# Patient Record
Sex: Female | Born: 1935 | Race: White | Hispanic: No | Marital: Single
Health system: Southern US, Community
[De-identification: ages and names within clinical notes are randomized; demographics above are authoritative.]

---

## 2005-04-08 ENCOUNTER — Ambulatory Visit: Payer: Self-pay | Admitting: Oncology

## 2005-04-09 ENCOUNTER — Ambulatory Visit: Payer: Self-pay | Admitting: Oncology

## 2005-05-10 ENCOUNTER — Ambulatory Visit: Payer: Self-pay | Admitting: Oncology

## 2005-10-10 ENCOUNTER — Ambulatory Visit: Payer: Self-pay | Admitting: Gastroenterology

## 2006-06-18 ENCOUNTER — Ambulatory Visit: Payer: Self-pay | Admitting: Oncology

## 2007-06-29 ENCOUNTER — Ambulatory Visit: Payer: Self-pay | Admitting: Internal Medicine

## 2008-10-05 ENCOUNTER — Ambulatory Visit: Payer: Self-pay | Admitting: Internal Medicine

## 2008-10-19 ENCOUNTER — Ambulatory Visit: Payer: Self-pay | Admitting: Internal Medicine

## 2008-11-07 ENCOUNTER — Ambulatory Visit: Payer: Self-pay | Admitting: Oncology

## 2008-12-01 ENCOUNTER — Ambulatory Visit: Payer: Self-pay | Admitting: Oncology

## 2008-12-08 ENCOUNTER — Ambulatory Visit: Payer: Self-pay | Admitting: Oncology

## 2009-04-09 ENCOUNTER — Ambulatory Visit: Payer: Self-pay | Admitting: Oncology

## 2009-04-19 ENCOUNTER — Ambulatory Visit: Payer: Self-pay | Admitting: Internal Medicine

## 2009-04-24 ENCOUNTER — Ambulatory Visit: Payer: Self-pay | Admitting: Oncology

## 2009-05-10 ENCOUNTER — Ambulatory Visit: Payer: Self-pay | Admitting: Oncology

## 2010-06-13 ENCOUNTER — Ambulatory Visit: Payer: Self-pay | Admitting: Internal Medicine

## 2011-08-05 ENCOUNTER — Emergency Department: Payer: Self-pay | Admitting: Unknown Physician Specialty

## 2011-08-26 ENCOUNTER — Ambulatory Visit: Payer: Self-pay | Admitting: Internal Medicine

## 2012-01-28 ENCOUNTER — Ambulatory Visit: Payer: Self-pay

## 2012-08-26 ENCOUNTER — Ambulatory Visit: Payer: Self-pay | Admitting: Internal Medicine

## 2012-09-08 ENCOUNTER — Ambulatory Visit: Payer: Self-pay | Admitting: Internal Medicine

## 2012-12-05 IMAGING — CR DG CHEST 2V
1 series · 2 of 2 positions shown · non-contrast
Comparison: none

REASON FOR EXAM: cp
COMMENTS:

PROCEDURE:     DXR - DXR CHEST PA (OR AP) AND LATERAL  - August 05, 2011  [DATE]
RESULT:     Comparison: 04/08/2005

[Series 1: w chest pa · 0.14mm/px · 2 of 2 slices shown]
[im 1/2]
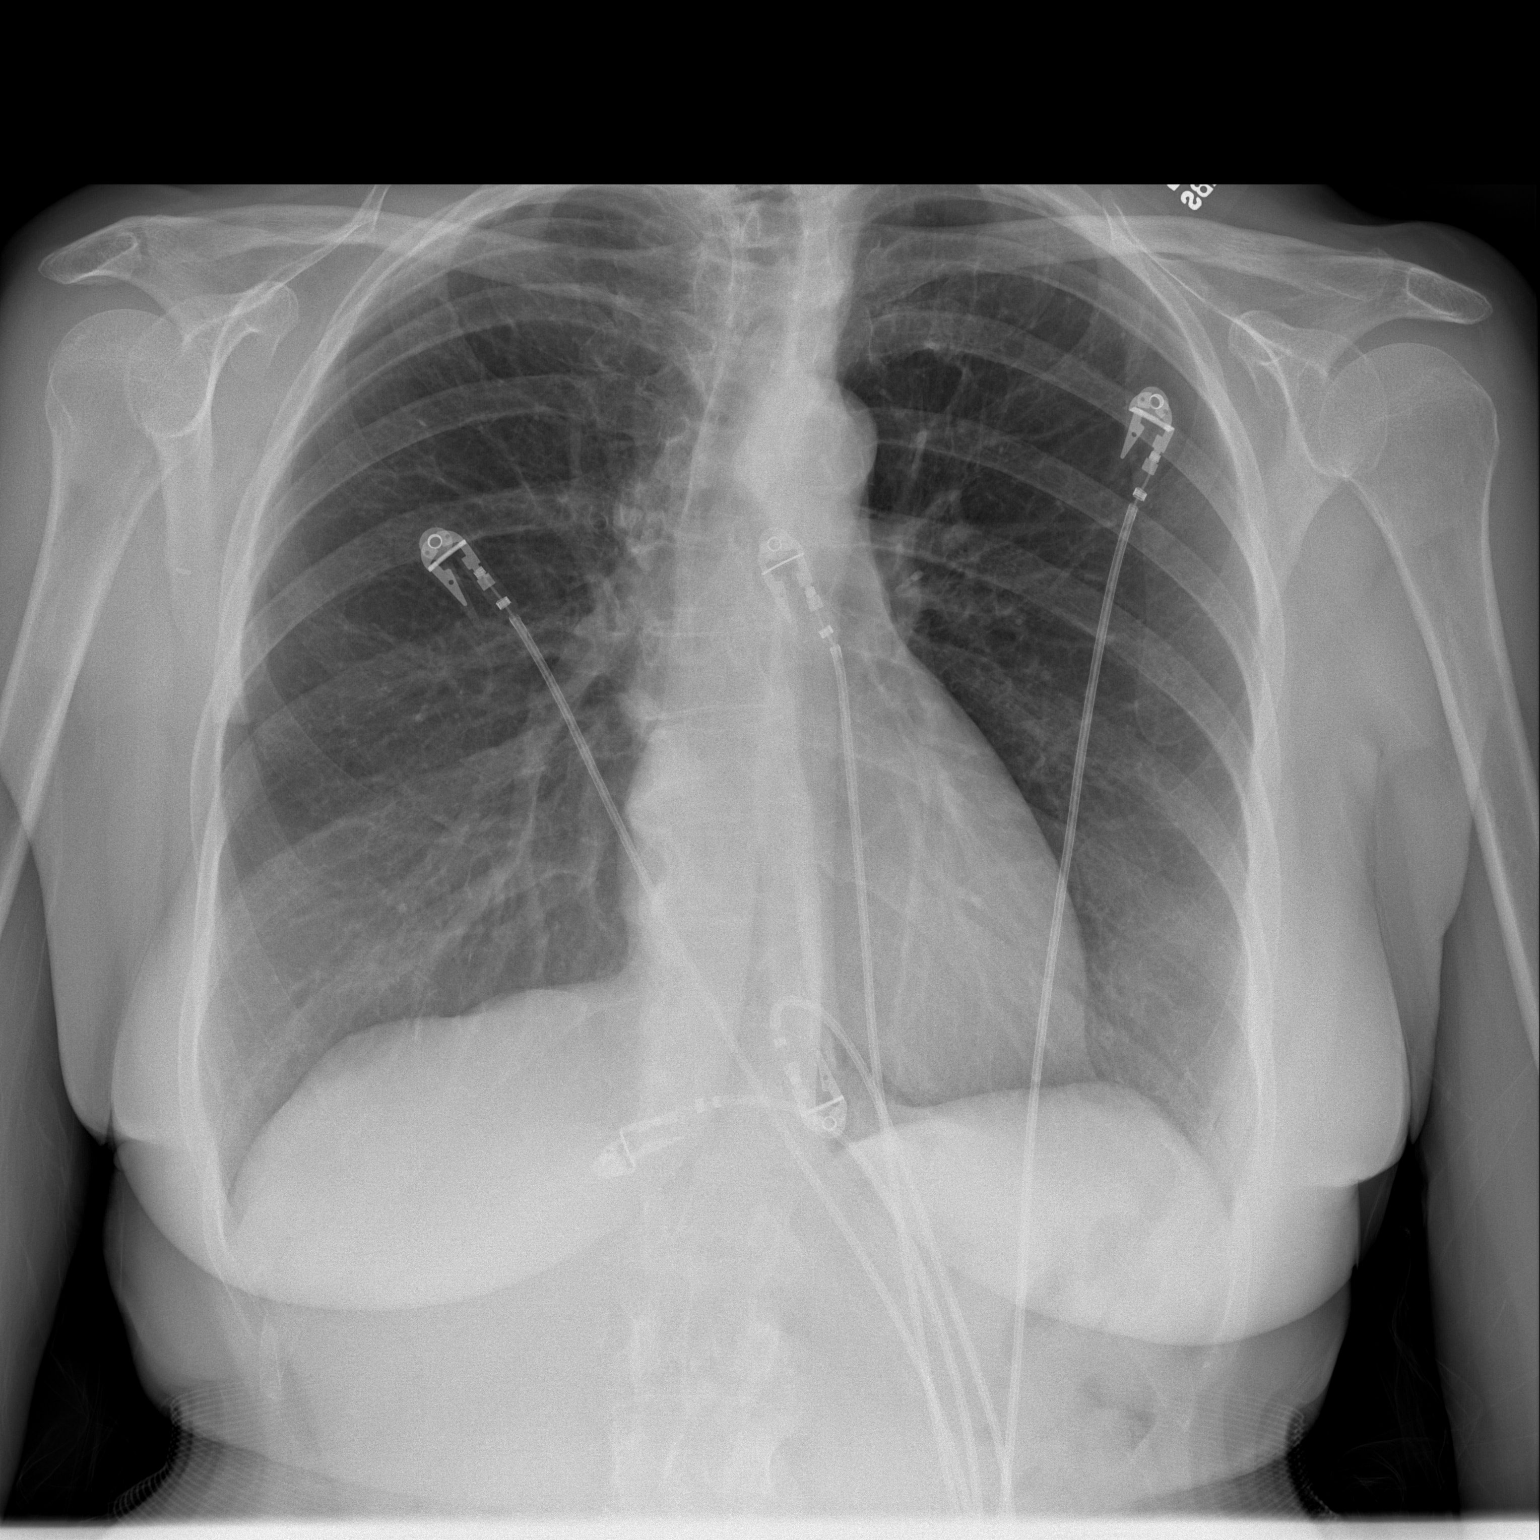
[im 2/2]
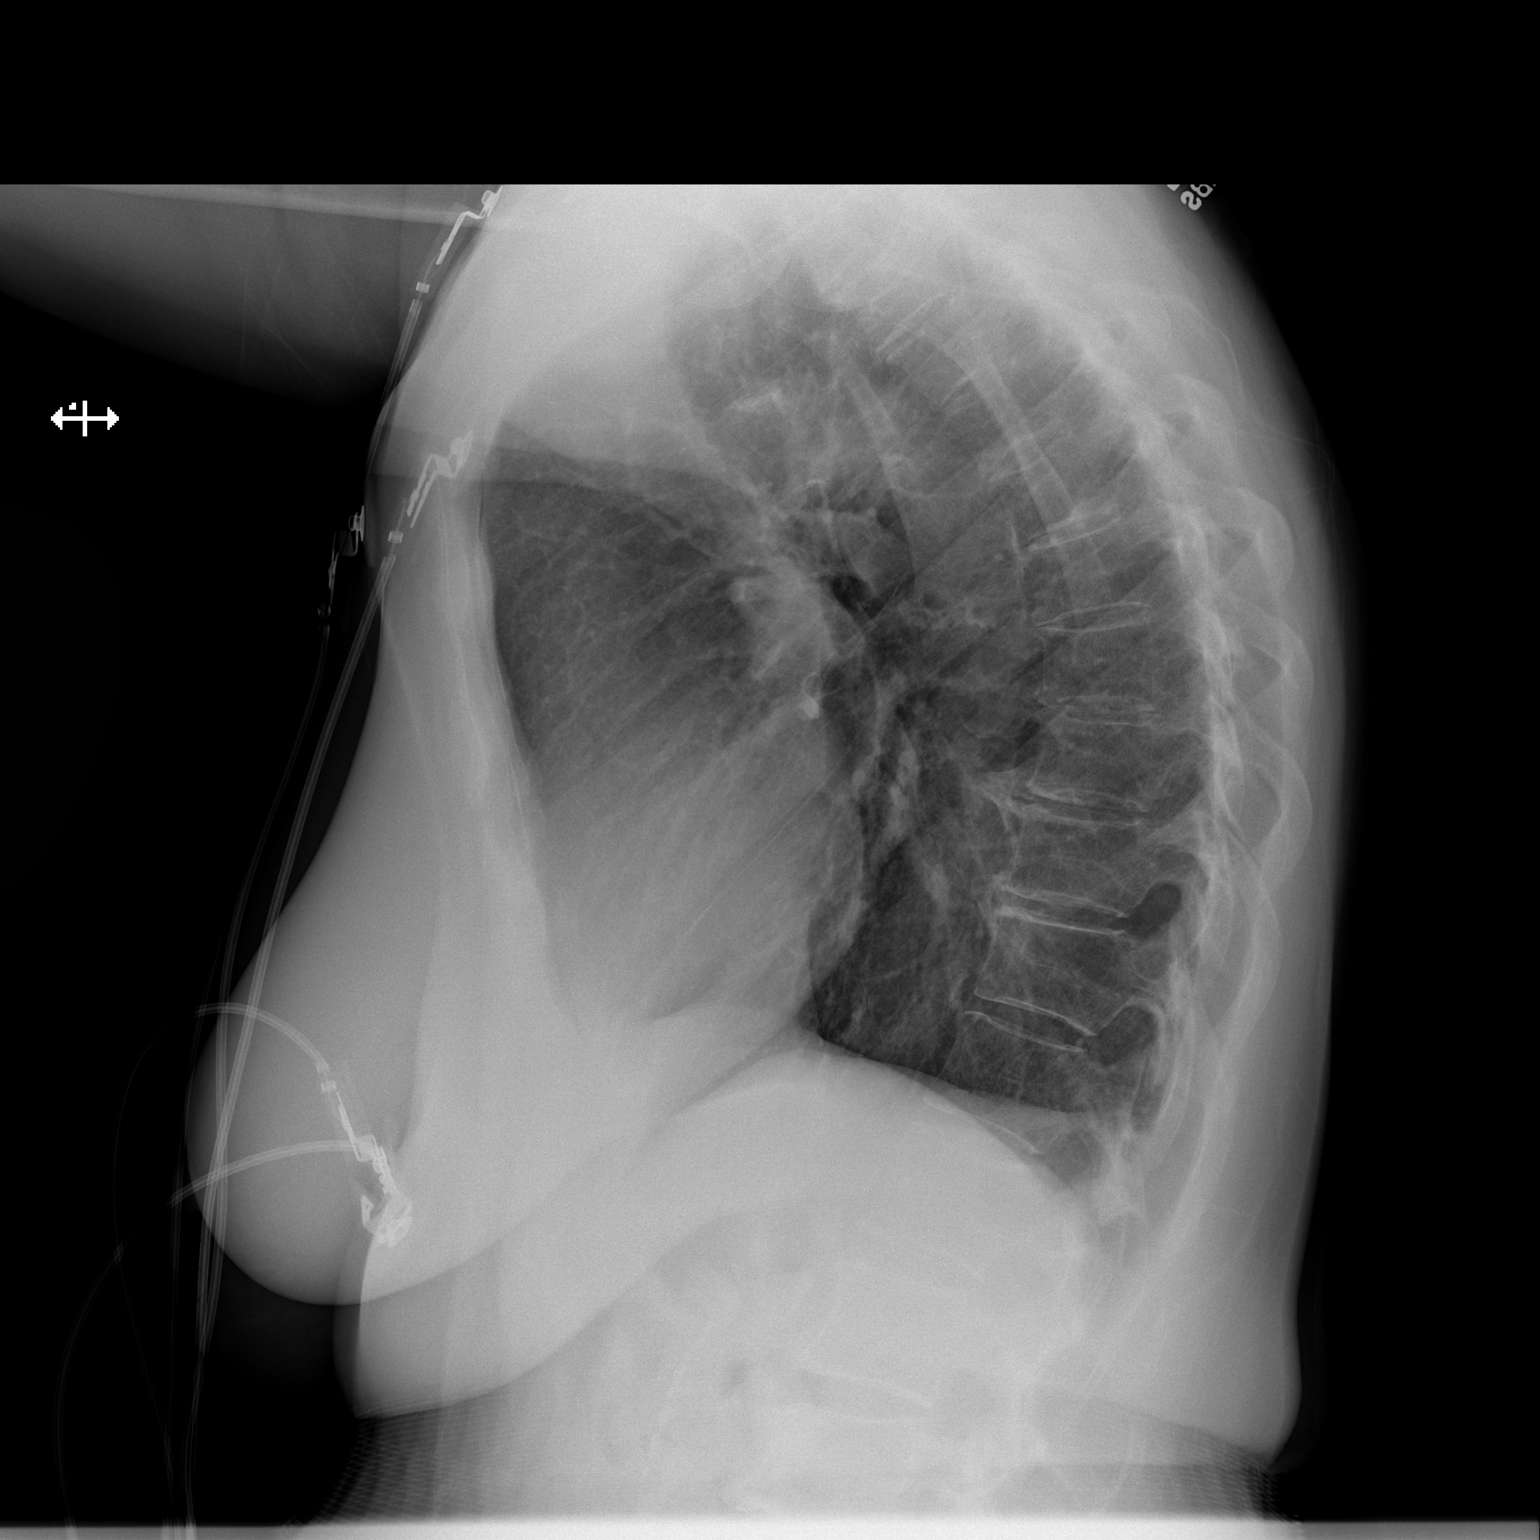

[2 of 2 positions shown; findings below may reference images not displayed]

FINDINGS: The heart is normal in size. There is a minimal is a curvature of the
thoracic spine. The lungs are clear. There is blunting of the right
posterior costophrenic angle which could be secondary to a small effusion
versus pleural thickening or eventration of the hemidiaphragm. This is
similar to prior.
IMPRESSION: 1. Blunting of the right posterior costophrenic angle may represent a small
pleural effusion versus pleural thickening or small eventration of the right
hemidiaphragm. This is similar prior.
2. Otherwise, no acute cardiopulmonary disease.

## 2013-10-04 ENCOUNTER — Ambulatory Visit: Payer: Self-pay | Admitting: Rheumatology

## 2013-11-23 ENCOUNTER — Ambulatory Visit: Payer: Self-pay | Admitting: Internal Medicine

## 2014-02-19 ENCOUNTER — Inpatient Hospital Stay: Payer: Self-pay | Admitting: Internal Medicine

## 2014-02-19 LAB — PROTIME-INR
INR: 1.1
Prothrombin Time: 13.9 secs (ref 11.5–14.7)

## 2014-02-19 LAB — CBC
HCT: 40.1 % (ref 35.0–47.0)
HGB: 13.1 g/dL (ref 12.0–16.0)
MCH: 30.4 pg (ref 26.0–34.0)
MCHC: 32.6 g/dL (ref 32.0–36.0)
MCV: 93 fL (ref 80–100)
Platelet: 199 10*3/uL (ref 150–440)
RBC: 4.3 10*6/uL (ref 3.80–5.20)
RDW: 13.4 % (ref 11.5–14.5)
WBC: 12.8 10*3/uL — ABNORMAL HIGH (ref 3.6–11.0)

## 2014-02-19 LAB — CK TOTAL AND CKMB (NOT AT ARMC)
CK, Total: 79 U/L
CK-MB: 2.8 ng/mL (ref 0.5–3.6)

## 2014-02-19 LAB — COMPREHENSIVE METABOLIC PANEL
ALBUMIN: 3.4 g/dL (ref 3.4–5.0)
ALK PHOS: 73 U/L
ALT: 88 U/L — AB (ref 12–78)
AST: 92 U/L — AB (ref 15–37)
Anion Gap: 10 (ref 7–16)
BUN: 17 mg/dL (ref 7–18)
Bilirubin,Total: 1.5 mg/dL — ABNORMAL HIGH (ref 0.2–1.0)
CALCIUM: 8.8 mg/dL (ref 8.5–10.1)
CHLORIDE: 105 mmol/L (ref 98–107)
CO2: 22 mmol/L (ref 21–32)
CREATININE: 0.88 mg/dL (ref 0.60–1.30)
EGFR (African American): >60
Glucose: 133 mg/dL — ABNORMAL HIGH (ref 65–99)
Osmolality: 277 (ref 275–301)
POTASSIUM: 3.9 mmol/L (ref 3.5–5.1)
Sodium: 137 mmol/L (ref 136–145)
Total Protein: 6.9 g/dL (ref 6.4–8.2)

## 2014-02-19 LAB — CK-MB: CK-MB: 2.3 ng/mL (ref 0.5–3.6)

## 2014-02-19 LAB — TROPONIN I: TROPONIN-I: 0.04 ng/mL

## 2014-02-19 LAB — PRO B NATRIURETIC PEPTIDE: B-Type Natriuretic Peptide: 7944 pg/mL — ABNORMAL HIGH (ref 0–450)

## 2014-02-19 LAB — APTT: ACTIVATED PTT: 30.7 s (ref 23.6–35.9)

## 2014-02-20 LAB — BASIC METABOLIC PANEL
Anion Gap: 9 (ref 7–16)
BUN: 15 mg/dL (ref 7–18)
CALCIUM: 8.6 mg/dL (ref 8.5–10.1)
Chloride: 104 mmol/L (ref 98–107)
Co2: 24 mmol/L (ref 21–32)
Creatinine: 0.93 mg/dL (ref 0.60–1.30)
GFR CALC NON AF AMER: 59 — AB
GLUCOSE: 142 mg/dL — AB (ref 65–99)
OSMOLALITY: 277 (ref 275–301)
POTASSIUM: 3.6 mmol/L (ref 3.5–5.1)
Sodium: 137 mmol/L (ref 136–145)

## 2014-02-20 LAB — CBC WITH DIFFERENTIAL/PLATELET
BASOS ABS: 0 10*3/uL (ref 0.0–0.1)
Basophil %: 0.1 %
EOS ABS: 0 10*3/uL (ref 0.0–0.7)
Eosinophil %: 0 %
HCT: 37.4 % (ref 35.0–47.0)
HGB: 12.7 g/dL (ref 12.0–16.0)
LYMPHS ABS: 0.4 10*3/uL — AB (ref 1.0–3.6)
Lymphocyte %: 3.6 %
MCH: 31.5 pg (ref 26.0–34.0)
MCHC: 33.9 g/dL (ref 32.0–36.0)
MCV: 93 fL (ref 80–100)
Monocyte #: 0.1 x10 3/mm — ABNORMAL LOW (ref 0.2–0.9)
Monocyte %: 1.2 %
NEUTROS ABS: 9.3 10*3/uL — AB (ref 1.4–6.5)
NEUTROS PCT: 95.1 %
Platelet: 183 10*3/uL (ref 150–440)
RBC: 4.01 10*6/uL (ref 3.80–5.20)
RDW: 13.5 % (ref 11.5–14.5)
WBC: 9.8 10*3/uL (ref 3.6–11.0)

## 2014-02-20 LAB — HEMOGLOBIN A1C: HEMOGLOBIN A1C: 5.7 % (ref 4.2–6.3)

## 2014-02-20 LAB — LIPID PANEL
Cholesterol: 145 mg/dL (ref 0–200)
HDL: 73 mg/dL — AB (ref 40–60)
Ldl Cholesterol, Calc: 65 mg/dL (ref 0–100)
Triglycerides: 36 mg/dL (ref 0–200)
VLDL Cholesterol, Calc: 7 mg/dL (ref 5–40)

## 2014-02-20 LAB — TSH: THYROID STIMULATING HORM: 0.504 u[IU]/mL

## 2014-02-20 LAB — CK-MB: CK-MB: 2.1 ng/mL (ref 0.5–3.6)

## 2014-02-20 LAB — TROPONIN I
TROPONIN-I: 0.11 ng/mL — AB
Troponin-I: 0.05 ng/mL

## 2014-02-21 LAB — CBC WITH DIFFERENTIAL/PLATELET
BASOS ABS: 0 10*3/uL (ref 0.0–0.1)
Basophil %: 0.1 %
Eosinophil #: 0 10*3/uL (ref 0.0–0.7)
Eosinophil %: 0 %
HCT: 37.5 % (ref 35.0–47.0)
HGB: 12.7 g/dL (ref 12.0–16.0)
LYMPHS ABS: 1.2 10*3/uL (ref 1.0–3.6)
LYMPHS PCT: 8.9 %
MCH: 31.4 pg (ref 26.0–34.0)
MCHC: 33.7 g/dL (ref 32.0–36.0)
MCV: 93 fL (ref 80–100)
MONOS PCT: 5.3 %
Monocyte #: 0.7 x10 3/mm (ref 0.2–0.9)
NEUTROS ABS: 11.1 10*3/uL — AB (ref 1.4–6.5)
Neutrophil %: 85.7 %
Platelet: 193 10*3/uL (ref 150–440)
RBC: 4.03 10*6/uL (ref 3.80–5.20)
RDW: 13.6 % (ref 11.5–14.5)
WBC: 13 10*3/uL — ABNORMAL HIGH (ref 3.6–11.0)

## 2014-02-21 LAB — BASIC METABOLIC PANEL
ANION GAP: 9 (ref 7–16)
BUN: 27 mg/dL — ABNORMAL HIGH (ref 7–18)
CALCIUM: 8.5 mg/dL (ref 8.5–10.1)
CREATININE: 1.08 mg/dL (ref 0.60–1.30)
Chloride: 102 mmol/L (ref 98–107)
Co2: 25 mmol/L (ref 21–32)
EGFR (African American): 57 — ABNORMAL LOW
GFR CALC NON AF AMER: 49 — AB
Glucose: 113 mg/dL — ABNORMAL HIGH (ref 65–99)
Osmolality: 278 (ref 275–301)
Potassium: 3.5 mmol/L (ref 3.5–5.1)
SODIUM: 136 mmol/L (ref 136–145)

## 2014-02-22 LAB — BASIC METABOLIC PANEL
ANION GAP: 4 — AB (ref 7–16)
BUN: 29 mg/dL — ABNORMAL HIGH (ref 7–18)
CALCIUM: 8.5 mg/dL (ref 8.5–10.1)
CHLORIDE: 105 mmol/L (ref 98–107)
CREATININE: 1.09 mg/dL (ref 0.60–1.30)
Co2: 28 mmol/L (ref 21–32)
EGFR (Non-African Amer.): 49 — ABNORMAL LOW
GFR CALC AF AMER: 57 — AB
Glucose: 80 mg/dL (ref 65–99)
OSMOLALITY: 279 (ref 275–301)
Potassium: 3.4 mmol/L — ABNORMAL LOW (ref 3.5–5.1)
SODIUM: 137 mmol/L (ref 136–145)

## 2014-02-22 LAB — WBC: WBC: 11 10*3/uL (ref 3.6–11.0)

## 2014-03-07 ENCOUNTER — Ambulatory Visit: Payer: Self-pay | Admitting: Family

## 2014-03-22 ENCOUNTER — Ambulatory Visit: Payer: Self-pay | Admitting: Obstetrics and Gynecology

## 2014-04-06 ENCOUNTER — Ambulatory Visit: Payer: Self-pay | Admitting: Family

## 2014-06-01 ENCOUNTER — Ambulatory Visit: Payer: Self-pay | Admitting: Family

## 2014-06-13 ENCOUNTER — Emergency Department: Payer: Self-pay | Admitting: Emergency Medicine

## 2014-06-13 LAB — URINALYSIS, COMPLETE
Bacteria: NONE SEEN
Bilirubin,UR: NEGATIVE
Blood: NEGATIVE
Glucose,UR: NEGATIVE mg/dL (ref 0–75)
KETONE: NEGATIVE
NITRITE: NEGATIVE
Ph: 5 (ref 4.5–8.0)
Protein: NEGATIVE
Specific Gravity: 1.013 (ref 1.003–1.030)
WBC UR: 26 /HPF (ref 0–5)

## 2014-06-15 LAB — URINE CULTURE

## 2014-06-28 ENCOUNTER — Ambulatory Visit: Payer: Self-pay | Admitting: Internal Medicine

## 2014-08-15 ENCOUNTER — Ambulatory Visit: Payer: Self-pay | Admitting: Family

## 2014-09-21 ENCOUNTER — Ambulatory Visit: Payer: Self-pay | Admitting: Cardiology

## 2014-11-05 ENCOUNTER — Inpatient Hospital Stay: Payer: Self-pay | Admitting: Internal Medicine

## 2014-11-06 ENCOUNTER — Ambulatory Visit: Payer: Self-pay | Admitting: Neurology

## 2014-11-08 ENCOUNTER — Ambulatory Visit
Admit: 2014-11-08 | Disposition: A | Discharge status: Home / Self Care | Payer: Self-pay | Attending: Internal Medicine | Admitting: Internal Medicine

## 2014-12-09 ENCOUNTER — Ambulatory Visit
Admit: 2014-12-09 | Disposition: A | Discharge status: Home / Self Care | Payer: Self-pay | Attending: Internal Medicine | Admitting: Internal Medicine

## 2014-12-09 DEATH — deceased

## 2014-12-31 NOTE — H&P (Signed)
PATIENT NAMMarlene [name]:  Lisa [name], Lisa [name] MR#:  [id number] DATE OF BIRTH:  06/23/1934  DATE OF ADMISSION:  02/19/2014  PRIMARY CARE PHYSICIAN:  Dr. Daniel NonesBert Klein.   REFERRING PHYSICIAN:  Dr. Daryel NovemberJonathan Williams.   CHIEF COMPLAINT:  Cough, shortness of breath.   HISTORY OF PRESENT ILLNESS:  Lisa [name] is a 81 year old pleasant white female with a history of hypertension, has been having cough with mild productive sputum for the last two weeks.  This is associated with shortness of breath.  Did not notice any increase with swelling in the lower extremities.  However, the shortness of breath has been gradually getting worse.  Today, the patient's sister insisted her to come to the Emergency Department.  Work-up in the Emergency Department, CT of the chest showed bilateral infiltrates.  X-ray shows bilateral pleural effusions associated with air space disease, mild pulmonary vascular congestion with some element of congestive heart failure.  The patient is also found to have elevated BNP of 8000, has mild elevation of the WBC count of 12.8 and a fever of 99.8.  Did not notice any fever at home.  Has significantly decreased appetite, has been having diarrhea, watery stools.  The patient received one dose of Lasix in the Emergency Department, diuresed about a liter of urine in the last 2 to 3 hours.   PAST MEDICAL HISTORY: 1.  Hypertension.  2.  Hyperlipidemia.  3.  Hypercalcemia.  4.  History of breast cancer, status post right mastectomy.  5.  Degenerative joint disease.  6.  Depression.  7.  Osteoporosis.   PAST SURGICAL HISTORY:  Mastectomy.   ALLERGIES:  SULFA.   HOME MEDICATIONS:   1.  Trazodone 25 mg once a day.   2.  Micardis 80 mg once a day.  3.  Aspirin 81 mg 2 tablets as needed.  4.  Alprazolam 0.5 mg as needed.   SOCIAL HISTORY:  No history of smoking, drinking alcohol or using illicit drugs, exposed to secondhand smoking from her daughter.  At baseline, is independent of ADLs and IADLs.    FAMILY HISTORY:  Mother with heart problems.   REVIEW OF SYSTEMS: CONSTITUTIONAL:  Experiencing generalized weakness.  EYES:  No change in vision.  EARS, NOSE, THROAT:  No change in hearing.  RESPIRATORY:  Has cough, shortness of breath.  CARDIOVASCULAR:  No chest pain, palpations.  GASTROINTESTINAL:  Has decreased appetite, has been having diarrhea.  GENITOURINARY:  No dysuria or hematuria.  HEMATOLOGIC:  No easy bruising or bleeding.  SKIN:  No rash or lesions.  ENDOCRINE:  No polyuria or polydipsia.  MUSCULOSKELETAL:  No joint pains and aches.  NEUROLOGIC:  No weakness or numbness in any part of the body.   PHYSICAL EXAMINATION:  GENERAL:  This is a well-built, well-nourished, age-appropriate female lying down in the bed, not in distress.  VITAL SIGNS:  Temperature 99.8, pulse 122, blood pressure 117/64, respiratory rate of 20, oxygen saturation is 94% on 2 liters of oxygen.  HEENT:  Head normocephalic, atraumatic.  Eyes, no scleral icterus.  Conjunctivae normal.  Pupils equal and react to light.  Extraocular movements are intact.  Mucous membranes moist.  No pharyngeal erythema.  NECK:  Supple.  No lymphadenopathy.  No JVD.  No carotid bruit.  No thyromegaly.  CHEST:  Has no focal tenderness.  Bilateral coarse breath sounds, bilateral wheezing.  HEART:  S1, S2, regular, tachycardia.  ABDOMEN:  Bowel sounds plus.  Soft, nontender, nondistended.  No hepatosplenomegaly.  EXTREMITIES:  No pedal edema.  Pulses 2+.  SKIN:  No rash or lesions.  MUSCULOSKELETAL:  Good range of motion in all the extremities.  NEUROLOGIC:  The patient is alert, oriented to place, person, and time.  Cranial nerves II through XII intact.  Motor 5 by 5 in upper and lower extremities.   LABORATORY DATA:  CMP is completely within normal limits.  CBC:  WBC of 12.8.  The rest of all the values are within normal limits.  Coag profile is well within normal limits.  CK 79, CK-MB of 2.8.  Troponin 0.04.  BNP of 8000.   Chest x-ray, air space disease, bilateral pleural effusions.  EKG, 12-lead:  Tachycardia, supraventricular.   ASSESSMENT AND PLAN:  Lisa [name] is a 81 year old female who comes with pneumonia and congestive heart failure, new onset.  1.  Pneumonia.  Treat it as a community-acquired pneumonia with Rocephin and Zithromax, DuoNebs and Solu-Medrol considering the patient's associated bronchitis.  2.  Congestive heart failure, new onset.  We will obtain echocardiogram.  We will continue with the diuresis.  Keep the patient on metoprolol 25 mg twice daily.  We will also obtain TSH, lipid profile and cardiac enzymes as well.  3.  Supraventricular tachycardia.  Most likely this is secondary to underlying infection.  Keep the patient on metoprolol 25 mg twice daily and follow up.  4.  Bilateral pleural effusions.  We will continue with the treatment for congestive heart failure as well as for pneumonia and follow up.  5.  Diarrhea.  We will check the Clostridium difficile toxin.  6.  Hypertension, currently well-controlled.  Continue with metoprolol and follow up.  Hold the Micardis for now.  7.  Keep the patient on deep vein thrombosis prophylaxis with Lovenox.   TIME SPENT:  50 minutes.     ____________________________ Susa Griffins, MD pv:ea D: 02/19/2014 22:32:12 ET T: 02/20/2014 01:54:14 ET JOB#: [id number]  cc: Susa Griffins, MD, <Dictator> Lynnea Ferrier, MD Susa Griffins MD ELECTRONICALLY SIGNED 02/23/2014 7:20

## 2014-12-31 NOTE — Discharge Summary (Signed)
PATIENT NAMMarlene [name]:  Maute, Nubia MR#:  [id number] DATE OF BIRTH:  [birth year]  DATE OF ADMISSION:  02/19/2014 DATE OF DISCHARGE:  02/22/2014  FINAL DIAGNOSES: 1.  Acute left-sided systolic congestive heart failure.  2.  Acute respiratory failure secondary to #1.  3.  Possible pneumonia.  4.  Hypertension, accelerated, labile.  5.  Anxiety.   HISTORY AND PHYSICAL: Please see dictated admission history and physical.   HOSPITAL COURSE: The patient was admitted with acute respiratory failure and evidence of pulmonary edema. There was a question of pneumonia, with the patient having low-grade temperature to 100.4 on arrival, however no further fevers during her hospitalization. She did have some cough, but no real sputum production. She was seen by cardiology, she underwent echocardiogram which revealed reduced LV ejection fraction at 40% to 45% with some evidence of diastolic dysfunction as well.   She continued to respond well to diuretics, she was changed over to oral medications. Heart rate and blood pressure responded well to treatment in addition. She ambulated with physical therapy and was weaned off of oxygen. She went 125 feet, although she did have some balance issues, part of which is chronic, and she has outpatient physical therapy already arranged for this. She was adamant that she needed to go home, so at this time she will be discharged to home in stable condition with physical activity to be up as tolerated with a single-prong cane. She is advised to weigh herself daily, calling for more than 2 pounds gain in 1 day or 5 pounds in 1 week or increasing signs or symptoms of heart failure, which were reviewed with the patient. She should follow a 2 gram sodium diet. We will have her follow up in our office within the next 1 week. She asked us to hold off on making a cardiology appointment until she saw us in the office, although she is aware that we strongly recommend that she follow up with  cardiologist for consideration for stress testing versus cardiac catheterization. She was noted to have some elevated troponins, however, this was thought to be demand ischemia, but given her new onset of heart failure, the possibility of ischemia must be further addressed.   DISCHARGE MEDICATIONS: 1.  Micardis 80 mg p.o. daily.  2.  Xanax 0.25 mg p.o. daily as needed for anxiety.  3.  Calcium 500 mg p.o. daily.  4.  Vitamin D 1000 units p.o. daily.  5.  Multivitamin 1 p.o. daily.  6.  Aldactone 25 mg p.o. daily.  7.  Carvedilol 6.25 mg p.o. b.i.d.  8.  Furosemide 40 mg p.o. daily.  9.  Levofloxacin 250 mg daily x7 days to complete a course.  10.  Aspirin 81 mg p.o. daily.   ____________________________ Lynnea FerrierBert J. Klein III, MD bjk:sb D: 02/22/2014 08:02:50 ET T: 02/22/2014 08:26:03 ET JOB#: [id number]  cc: Curtis SitesBert J. Klein III, MD, <Dictator> Daniel NonesBERT KLEIN MD ELECTRONICALLY SIGNED 02/23/2014 8:08

## 2014-12-31 NOTE — Consult Note (Signed)
PATIENT NAMMarlene [name]:  [name], Lisa [name] MR#:  [id number] DATE OF BIRTH:  1934-08-21  DATE OF CONSULTATION:  02/20/2014  CONSULTING PHYSICIAN:  Laurier NancyShaukat A. Maneh Sieben, MD  INDICATION FOR CONSULTATION: Congestive heart failure.   HISTORY OF PRESENT ILLNESS: This is a 81 year old white female  with a past medical history of hypertension, who has been normally followed with me in the office at [facility], but is a patient of Dr. Graciela HusbandsKlein, presented to the hospital with a 3 to 4 month history of shortness of breath, PND, orthopnea, but no leg swelling. She has been having also cough productive of white to yellow sputum. Her BNP when she came was 8000. I was asked to evaluate the patient because of congestive heart failure. She also complains of occasional tightness in the left precordium associated with shortness of breath.   PAST MEDICAL HISTORY: History of hypertension, hyperlipidemia, hypercalcemia, history of breast cancer, status post right mastectomy, degenerative joint disease, depression, osteoporosis.   ALLERGIES: SULFA.   PAST SURGICAL HISTORY: She had mastectomy in the past.   HOME MEDICATIONS: Micardis 80 mg, aspirin 81 mg.   SOCIAL HISTORY: She denies EtOH abuse or smoking.   FAMILY HISTORY: Mother had, she is not clear about it but mother had heart disease and a brother has heart disease.   PHYSICAL EXAMINATION: GENERAL: She is alert, oriented x 3, a bit confused but recognizes who I am.  VITAL SIGNS: Blood pressure is 115/68, respirations 18, pulse 98, temperature 98.1, saturation is 89.  NECK: Revealed positive 8 cm JVD.  LUNGS: There is few crepitation at the bases.  HEART: Regular rate and rhythm. Normal S1, S2. No audible murmur.   ABDOMEN: Soft, nontender, positive bowel sounds.  EXTREMITIES: No pedal edema. EKG was done which shows, had supraventricular tachycardia, 143 beats per minute, old anteroseptal wall myocardial infarction, nonspecific ST-T changes. The monitor currently shows  sinus rhythm. Echo was done. Ejection fraction was 40% to 45%, elevated left atrial pressure. Mild tricuspid regurgitation, mild aortic fiber calcification, mild to moderate mitral regurgitation, but she had large left pleural effusion. Her troponin was 0.11, CPK was okay. Her cholesterol panel was unremarkable. HDL is actually very good 73. Her BUN is 15, creatinine 0.93. Her BNP was 7944.   ASSESSMENT AND PLAN: The patient has systolic heart failure with decreased left ventricular systolic function on echocardiogram with trivial pericardial effusion, but large left lower effusion. She has mildly elevated troponin due to demand ischemia. Initially when she presented, she presented with supraventricular tachycardia, right now she is back in sinus rhythm. She was started on Lovenox, antibiotics, steroids,  but she is also getting Lasix 40 mg IV twice a day. We will add Aldactone because of the left ventricular dysfunction, and also we will add ACE inhibitor. She probably has coronary artery disease and will need further work-up for that. At this time, we get her from decompensated to compensated heart failure, and may have to do cardiac catheterization during this hospitalization. Thank you very much for the referral.   ____________________________ Laurier NancyShaukat A. Kimberli Winne, MD sak:sg D: 02/20/2014 11:58:46 ET T: 02/20/2014 13:04:50 ET JOB#: [id number]  cc: Laurier NancyShaukat A. Cruise Baumgardner, MD, <Dictator> Laurier NancySHAUKAT A Charmayne Odell MD ELECTRONICALLY SIGNED 03/23/2014 9:21

## 2015-01-08 NOTE — Discharge Summary (Signed)
 PATIENT NAMMarlene [name]:  Mankowski, Memorie MR#:  [id number] DATE OF BIRTH:  1933/11/05  DATE OF ADMISSION:  11/05/2014 DATE OF DISCHARGE:  11/14/2014  FINAL DIAGNOSES:  1. Acute cerebrovascular accident, likely due to atrial fibrillation.  2. Expressive aphasia and right hemiplegia secondary to cerebrovascular accident.  3. Chronic left-sided systolic congestive heart failure.  4. Hypertension, accelerated.  5. Metabolic encephalopathy.   HISTORY AND PHYSICAL: Please see dictated admission history and physical.   HOSPITAL COURSE: The patient was admitted after being found at home aphasic and with right-sided weakness. Imaging confirmed a CVA. She was placed on telemetry monitor and rhythm was followed, and no significant dysrhythmias were seen. Neurology saw the patient and they felt that, due to her severe CHF and the nature of the stroke on imaging, that this is likely an embolic event and that she had high likelihood of atrial fibrillation as a source.   Physical therapy, occupational therapy and speech therapy worked with the patient. She had some mild improvement in her right-sided weakness, however, remained aphasic. She had episodes of significant agitation and confusion consistent with metabolic encephalopathy. Repeat imaging was performed, which did not show any cerebral edema.   Her oral intake was followed closely, and it was clear that she would not be able to take enough in orally to sustain herself. A long discussion was held with her family members, including her brother and her sister, and with the assistance of palliative care. She was a DO NOT RESUSCITATE. We addressed PEG tube after she showed little improvement over the course of about a week, and the family members are in agreement that she would never want a PEG tube. This is consistent with my experience with her through the years that I have been her primary care provider.  Upon further discussion, she was made comfort measures only and  was transferred to hospice home.   DISCHARGE MEDICATIONS:  1. Lasix 40 mg p.o. daily.  2. Zofran 4 mg sublingually q. 6 hours as needed for nausea.  3. Lorazepam 0.5 mg 1 to 2 tablets p.o. q. 2-4 hours as needed for agitation or anxiety.  4. Morphine 20 mg/mL, 0.5. mL p.o. q. 1-2 hours.   DIET: As tolerated.  ACTIVITY: As tolerated.  Foley catheter left in place secondary to urinary retention. Oxygen at 2 liters nasal cannula to use as needed.   All of the medications were stopped as the target was comfort for this patient. As stated above, the patient was DO NOT RESUSCITATE, and an out-of-facility DNR was sent with her to the facility.     ____________________________ Lynnea FerrierBert J. Martina Brodbeck III, MD bjk:jh D: 11/15/2014 07:47:09 ET T: 11/15/2014 17:05:40 ET JOB#: [id number]  cc: Lynnea FerrierBert J. Spenser Harren III, MD, <Dictator> Daniel NonesBERT Seniyah Esker MD ELECTRONICALLY SIGNED  8:44

## 2015-01-08 NOTE — Consult Note (Signed)
HEMATOLOGY followup note - family at bedside states overall weakness same. Aphasic.still weak but feels better overall. no fevers. Per d/w Dr.Klein, being considered for PEG tube.resting in bed, alert and follows simple commands, aphasic, no acute distress.            vitals - 99.7, 111, 19, 132/82, 98% on room air          lungs - bilateral good breath sounds, no rhonchi          abd - soft, NT          ext - no pedal edema  WBC 10.0, Hb 16.6, Hct 50.8%, platelets 78K, Cr 0.8, Ca 8.5, Bili 2.4, albumin 2.9. INR 1.3, PTT unremarkable. Repeat Fibrinogen today 154. Serum  EPO 35. HIPA negative.  81 year old woman who was admitted on 11/05/14 with a left-sided middle cerebral artery subacute infarct.  On admission, her platelet count was 132K, then decreased to 64K on 11/10/14. Etiology of thrombocytopenia unclear, HIPA is  negative. Platelet count slightly better at 78K, no obvious bleeding issues. Per d/w Dr.Klein, anticoagulation being deferred till PEG tube is inserted and platelet count further improves. Continue to monitor. Low fibrinogen is likely from consumption or possibility of occult chronic liver disease (has INR of 1.3, abnormal bili, low albumin). Also has persistent Erythrocytosis of unclear etiology, workup is pending. Given CVA, will try small volume phlebotomy 250 mL today to try and control Hct better. Otherwise continue Hematology recommendations as per Dr.Corcoran's note from yesterday. Will continue to follow intermittently as indicated.   Electronic Signatures: Izola PricePandit, Ricco Dershem Raj (MD)  (Signed on 06-Mar-16 00:21)  Authored  Last Updated: 06-Mar-16 00:21 by Izola PricePandit, Zhoe Catania Raj (MD)

## 2015-01-08 NOTE — Consult Note (Signed)
PATIENT NAMMarlene [name]:  [name], Lisa [name]:  July 23, 1934  DATE OF CONSULTATION:  11/06/2014  CONSULTING PHYSICIAN:  Pauletta BrownsYuriy Jozi Malachi, MD  REASON FOR CONSULTATION:  Stroke.   HISTORY OF PRESENT ILLNESS:  This is a 81 year old female with past medical history of hypertension, history of diagnosed chronic heart failure, questionable history of atrial fibrillation presenting with episode of aphasia that is likely a few days old as well as a right facial droop. The patient was found to have large left frontal stroke appearing in the MCA division and the patient is aphasic.   PAST MEDICAL HISTORY: Significant for congestive heart failure, hypertension, hyperlipidemia, and hypercapnia.   HOME MEDICATIONS: Reviewed.  SOCIAL HISTORY:  No history of EtOH or alcohol use.   FAMILY HISTORY: Cardiac disease in the family.   REVIEW OF SYSTEMS: Unable to obtain at this point as the patient is aphasic.   LABORATORY DATA: Work-up reviewed.   IMAGING: CAT scan described above.   NEUROLOGIC: The patient is able to follow commands. She has difficulty getting words out, aphasic. Tongue is midline. Right facial droop. Weakness of right upper extremity 4/5 with a right upper extremity drift and some numbness on the right side. Reflexes appear to be symmetrical.   IMPRESSION: A 81 year old female with subacute left frontal stroke with aphasia seen on the CAT scan. I believe her symptoms will improve but the patient will not be at baseline, specifically her speech.  Case discussed with niece and patient's other family members over the phone. I do not think we need an MRI at this point as clearly there is a subacute infarct in the superior MCA division on the left because I do not think it is going to change the management.  The patient was questionably taking antiplatelet medication at home even though I do not think was consistently daily. Please restart her daily antiplatelet therapy. Aspirin 325  should be adequate, start her on statin therapy. The patient should follow up as an outpatient with cardiology because of questionable atrial fibrillation never seen on prior admissions. I would not anticoagulate her for 2-3 weeks post stroke due to the significant size of this left frontal stroke.   Thank you and please call with any questions.   ____________________________ Pauletta BrownsYuriy Philbert Ocallaghan, MD yz:mc D: 11/06/2014 10:17:55 ET T: 11/06/2014 10:37:57 ET JOB#: [id number]  cc: Pauletta BrownsYuriy Maximillion Gill, MD, <Dictator> Pauletta BrownsYURIY Khloee Garza MD ELECTRONICALLY SIGNED 11/24/2014 12:10

## 2015-01-08 NOTE — Consult Note (Signed)
PATIENT NAMMarlene [name]:  Grieshop, Itha MR#:  [id number] DATE OF BIRTH:  Dec 01, 1933  DATE OF CONSULTATION:  11/10/2014  REFERRING PHYSICIAN:  Lynnea FerrierBert J. Klein III, MD  CONSULTING PHYSICIAN:  Melissa C. Merlene Pullingorcoran, MD  ADDENDUM: This is a continuation.  HISTORY OF PRESENT ILLNESS: The patient has been followed by neurology. Initial notes indicate a plan to postpone anticoagulation until the end of this week. Anticoagulation has been held secondary to a drop in her platelet count. The etiology of her stroke was felt likely due to paroxysmal atrial fibrillation. She is currently in normal sinus rhythm.   The patient's sister notes that prior to these events she was on no new medications at home. The patient does not take herbal products.   PAST MEDICAL HISTORY: Congestive heart failure in 2015, hypertension, hyperlipidemia, hypercalcemia, right breast cancer in 1968 with positive lymph nodes treated with chemotherapy and radiation, degenerative joint disease, depression, osteoporosis.  PAST SURGICAL HISTORY: Right lumpectomy 1968.  FAMILY HISTORY: The patient's mother died of a myocardial infarction. Her father died with bone cancer. He also had coronary artery disease. Her sister has a history of breast cancer. Her brother has back problems. She has a daughter who has schizophrenia.  SOCIAL HISTORY: The patient lives alone. She does not smoke or drink alcohol.   REVIEW OF SYSTEMS: Unable to be obtained secondary to the patient's aphasia. According to the patient's sister, prior to recent events, she had no fevers, sweats, or weight loss. She denied any visual changes. She denied any chest pain, shortness of breath, or cough. She denied any abdominal or urinary symptoms. She was fully functional.   HOME MEDICATIONS: Vitamin D3, Os-Cal, Zofran, Lasix, Centrum Silver, carvedilol, aspirin, alprazolam.  ALLERGIES: SULFA.  PHYSICAL EXAMINATION: GENERAL: The patient is sitting comfortable on the medical unit in  no acute distress. She appears restless.  VITAL SIGNS: Pulse 68, respiratory rate 17, blood pressure 139/94, temperature 97.6, oxygen saturation 95% on 3 L. HEAD: Wallace CullensGray hair. Face with right-sided droop.  EYES: Pupils equal, round, reactive to light and accommodation. Unable to assess extraocular movement secondary to patient cooperation.  EAR, NOSE, MOUTH, AND THROAT: No apparent oral lesions. The patient did not open up her mouth very wide. Mouth dry.  NECK: Supple without adenopathy. CARDIOVASCULAR: Regular rate and rhythm without murmur, rub, or gallop. LUNGS: Clear to auscultation with decreased breath sounds at the bases and direct crackles. ABDOMEN: Soft, nontender with active bowel sounds and no appreciable hepatosplenomegaly. LYMPH NODES: No palpable cervical, supraclavicular, axillary, or inguinal adenopathy. EXTREMITIES: No edema. NEUROLOGIC: Unable to consistently follow commands. Expressive aphasia. Clear right-sided weakness. Unable to assess sensitivity to touch. Positive Babinski. Gait not tested.   DATA REVIEWED: Labs, radiographic studies, and imaging studies during this admission were reviewed and noted in the history of present illness. Peripheral smear reviewed today is unremarkable.  ASSESSMENT: The patient is a 81 year old woman who was admitted with a left-sided middle cerebral artery subacute infarct. On admission, platelet count was 132,000.  Her platelets count has decreased decreased daily and is currently 64,000. Her hematocrit was initially slightly elevated at 48.6 and is currently 52. White count is normal. Bilirubin is elevated with a predominate indirect fraction. She has no evidence of hemolysis. She may have underlying Gilbert's disease.   The etiology of her thrombocytopenia is unclear. This may be related to heparin-induced thrombocytopenia, as she has been on Lovenox for deep venous thrombosis prophylaxis. The timing is slightly early with progressive  thrombocytopenia since admission. Her  nutritional status is unclear. A general workup for thrombocytopenia will be performed.   The patient has erythrocytosis. She does not appear to be dehydrated. Per her sister's history, this appears to be new.   PLAN: 1.  Labs today to include a PT, PTT, fibrinogen, reticulocyte count, ANA, serotonin release assay to assess for heparin-induced thrombocytopenia. Hepatitis B and C serologies, iron studies. 2.  Peripheral smear for review - done. 3.  AM labs to include B12, folate, TSH, EPO level, and JAK2. 4.  Platelet factor 4 tubes are being obtained for additional assay to assess for heparin-induced thrombocytopenia (HIT). 5.  Agree with holding Lovenox given the concern for possible heparin-induced thrombocytopenia. 6.  If anticoagulation is felt necessary, consider anticoagulation with Arixtra, as this is typically not problematic in patients with heparin-induced thrombocytopenia (HIT).  Typically feel comfortable with anticoagulation if platelet count greater than 50,000. However, if heparin-induced thrombocytopenia (HIT) is diagnosed, she will require anticoagulation with either Arixtra or argatroban.   Thank you for allowing me to participate in Lisa [name]'s care. She will be followed closely with you while hospitalized and after discharge.    ____________________________ Ferdie Ping. Merlene Pulling, MD mcc:bm D: 11/11/2014 03:11:00 ET T: 11/11/2014 03:38:28 ET JOB#: [id number]  cc: Lynnea Ferrier, MD Melissa C. Merlene Pulling, MD, <Dictator>

## 2015-01-08 NOTE — Consult Note (Signed)
PATIENT NAMMarlene [name]:  [name], Lisa MR#:  [id number] DATE OF BIRTH:  1934/08/01  DATE OF CONSULTATION:  11/10/2014  REFERRING PHYSICIAN:  Lynnea FerrierBert J. Klein III, MD  CONSULTING PHYSICIAN:  Melissa C. Merlene Pullingorcoran, MD  REASON FOR CONSULTATION: Thrombocytopenia and elevated LDH.  CHIEF COMPLAINT: The patient is a 81 year old woman who was admitted on 11/05/2014 with confusion and aphasia with imaging studies revealing a left frontal subacute cerebrovascular accident.  HISTORY OF PRESENT ILLNESS: The patient is unable to provide a history given her aphasia. History is obtained from her sister.   The patient has a history of congestive heart failure in June 2015. She also has hypertension and hyperlipidemia. According to the patient's sister, she has no prior history of any abnormal blood counts.   The patient was in her usual state of health until 10/31/2014. She lives alone. Her sister visits her frequently. She last spoke to her on 10/31/2014. The patient's sister states that she typically calls her once a day at 10 a.m. She did not answer the phone for several days. On 02/26 she visited the patient at home. The patient would not let her in and did not speak to her. Sister returned the following day on 02/27 secondary to concern. EMS was called.   The patient was noted to have a right-sided weakness as well as expressive aphasia and right-sided facial droop. Head CT on 11/05/2014 revealed a large low density region in the left frontal lobe consistent with a subacute inferior territory left middle cerebral artery infarct. There was no intraparenchymal hemorrhage. Initial labs on 11/05/2014 included a hematocrit of 48.6, hemoglobin 15.6, platelets 132,000, white count 10,000. Comprehensive metabolic panel included a BUN of 42, creatinine 1.19. Liver function tests were elevated with a bilirubin of 3.5, SGOT 85, SGPT 88. Albumin was 3.7. CK total 286, CK-MB 9.9, and troponin 0.08.   The patient has had followup  imaging studies as well as a workup. Carotid duplex on 11/05/2014 was negative. Followup head CT on 11/08/2014 revealed an evolving left MCA infarct. Abdominal ultrasound on 11/08/2014 due to her elevated liver function tests revealed only gallbladder sludge. Echocardiogram on 11/07/2014 revealed an ejection fraction of 20%-25%.   The patient's labs have been followed serially. Hematocrit has remained elevated and has slowly increased. Labs today include a hematocrit of 52, hemoglobin 16.4. Her platelets have drifted down from 132,000 on 02/27 to 125,000 on 02/29; 109,000 on 03/01; 81,000 on 03/02; and 64,000 on 03/03. White count has remained normal. Liver function tests have remained elevated with a current bilirubin of 2.5. Bilirubin was fractionated with a direct fraction of 0.5 on 11/08/2014. SGOT and SGPT have improved and are 64 and 61 respectively today.   The patient was initially placed on Lovenox for DVT prophylaxis. Lovenox was discontinued today secondary to a decline in platelet count. She is on aspirin. She has no bleeding. She has no new thrombosis. Her current stroke is evolving. Her aphasia remains marked. She also appears to have some receptive aphasia.   The patient has been followed by neurology. Initial notes indicate a plan to postpone anticoagulation until the end of this week. Anticoagulation has been held secondary to a drop in her platelet count. The etiology of her stroke was felt likely due to paroxysmal atrial fibrillation. She is currently in normal sinus rhythm.   The patient's sister notes that prior to these events she was on no new medications at home. The patient does not take herbal products.   PAST MEDICAL  HISTORY: Congestive heart failure in 2015, hypertension, hyperlipidemia, hypercalcemia, right breast cancer in 1968 with positive lymph nodes treated with chemotherapy and radiation, degenerative joint disease, depression, osteoporosis.  PAST SURGICAL HISTORY:  Right lumpectomy 1968.  FAMILY HISTORY: The patient's mother died of a myocardial infarction. Her father died with bone cancer. He also had coronary artery disease. Her sister has a history of breast cancer. Her brother has back problems. She has a daughter who has schizophrenia.  SOCIAL HISTORY: The patient lives alone. She does not smoke or drink alcohol.   REVIEW OF SYSTEMS: Unable to be obtained secondary to the patient's aphasia. According to the patient's sister, prior to recent events, she had no fevers, sweats, or weight loss. She denied any visual changes. She denied any chest pain, shortness of breath, or cough. She denied any abdominal or urinary symptoms. She was fully functional.   HOME MEDICATIONS: Vitamin D3, Os-Cal, Zofran, Lasix, Centrum Silver, carvedilol, aspirin, alprazolam.  ALLERGIES: SULFA.  PHYSICAL EXAMINATION: GENERAL: The patient is sitting comfortable on the medical unit in no acute distress. She appears restless.  VITAL SIGNS: Pulse 68, respiratory rate 17, blood pressure 139/94, temperature 97.6, oxygen saturation 95% on 3 L. HEAD: Wallace Cullens hair. Face with right-sided droop.  EYES: Pupils equal, round, reactive to light and accommodation. Unable to assess extraocular movement secondary to patient cooperation.  EAR, NOSE, MOUTH, AND THROAT: No apparent oral lesions. The patient did not open up her mouth very wide. Mouth dry.  NECK: Supple without adenopathy. CARDIOVASCULAR: Regular rate and rhythm without murmur, rub, or gallop. LUNGS: Clear to auscultation with decreased breath sounds at the bases and direct crackles. ABDOMEN: Soft, nontender with active bowel sounds and no appreciable hepatosplenomegaly. LYMPH NODES: No palpable cervical, supraclavicular, axillary, or inguinal adenopathy. EXTREMITIES: No edema. NEUROLOGIC: Unable to consistently follow commands. Expressive aphasia. Clear right-sided weakness. Unable to assess sensitivity to touch. Positive Babinski.  Gait not tested.   DATA REVIEWED: Labs, radiographic studies, and imaging studies during this admission were reviewed and noted in the history of present illness. Peripheral smear reviewed today is unremarkable.  ASSESSMENT: The patient is a 81 year old woman who was admitted with a left-sided middle cerebral artery subacute infarct. On admission, platelet count was 132,000.  Her platelets count has decreased decreased daily and is currently 64,000. Her hematocrit was initially slightly elevated at 48.6 and is currently 52. White count is normal. Bilirubin is elevated with a predominate indirect fraction. She has no evidence of hemolysis. She may have underlying Gilbert's disease.   The etiology of her thrombocytopenia is unclear. This may be related to heparin-induced thrombocytopenia, as she has been on Lovenox for deep venous thrombosis prophylaxis. The timing is slightly early with progressive thrombocytopenia since admission. Her nutritional status is unclear. A general workup for thrombocytopenia will be performed.   The patient has erythrocytosis. She does not appear to be dehydrated. Per her sister's history, this appears to be new.   PLAN: 1.  Labs today to include a PT, PTT, fibrinogen, reticulocyte count, ANA, serotonin release assay to assess for heparin-induced thrombocytopenia. Hepatitis B and C serologies, iron studies. 2.  Peripheral smear for review - done. 3.  AM labs to include B12, folate, TSH, EPO level, and JAK2. 4.  Platelet factor 4 tubes are being obtained for additional assay to assess for heparin-induced thrombocytopenia (HIT). 5.  Agree with holding Lovenox given the concern for possible heparin-induced thrombocytopenia. 6.  If anticoagulation is felt necessary, consider anticoagulation with Arixtra, as this  is typically not problematic in patients with heparin-induced thrombocytopenia (HIT).  Typically feel comfortable with anticoagulation if platelet count greater than  50,000. However, if heparin-induced thrombocytopenia (HIT) is diagnosed, she will require anticoagulation with either Arixtra or argatroban.   Thank you for allowing me to participate in Ms. Boultinghouse's care. She will be followed closely with you while hospitalized and after discharge.    ____________________________ Ferdie Ping. Merlene Pulling, MD mcc:bm D: 11/11/2014 02:49:00 ET T: 11/11/2014 03:18:35 ET JOB#: [id number] / [id number]  cc: Melissa C. Merlene Pulling, MD, <Dictator> Rosey Bath MD ELECTRONICALLY SIGNED 11/12/2014 5:29

## 2015-01-08 NOTE — H&P (Signed)
PATIENT NAMEMORINE, [name] MR#:  [id number] DATE OF BIRTH:  12/02/1933  DATE OF ADMISSION:  11/05/2014  PRIMARY CARE PROVIDER: Lynnea Ferrier, MD   CHIEF COMPLAINT: Confusion and aphasia.   HISTORY OF PRESENT ILLNESS: The patient is a 81 year old white female with history of hypertension, history of a recent diagnosis of systolic CHF in June 2015 who lives by herself. She has a sister who normally checks on her and her sister reports that last they spoke was on Monday and then she did not pick up her calls, and finally she went to visit her house yesterday. The patient walked to the door but refused to open the door. Her sister thought maybe she had some sort of laryngitis and therefore she was not talking or letting her in. However, the more she thought about it she decided to call EMS. The patient was brought by EMS to the hospital. She is still not able to communicate or follow any commands. She had a CT scan of the head which showed a large low density region in the frontal left lobe most consistent with a subacute infarct. The patient is able to ambulate and is able to move her left extremity, but the right upper extremity is a little weaker than the other side. The patient otherwise unable to give any further history.   PAST MEDICAL HISTORY: Significant for history of systolic CHF, hypertension, hyperlipidemia, hypercalcemia, history of breast cancer status post right mastectomy, degenerative joint disease, depression, osteoporosis.   PAST SURGICAL HISTORY: Status post mastectomy.   ALLERGIES: SULFA.   HOME MEDICATIONS: She is on vitamin D3 at 1000 international units daily, Os-Cal 500/1250 one tab p.o. daily, Zofran 4 mg q. 4 p.r.n., myocarditis 80 one tab p.o. daily, Lasix 40 two daily, Centrum Silver 1 tab p.o. daily, carvedilol 6.25 one tab p.o. b.i.d., aspirin 81 mg 1 tab p.o. daily, alprazolam 0.25 one tab p.o. daily as needed.   SOCIAL HISTORY: No history of smoking or drinking or  alcohol use.   FAMILY HISTORY: Mother with heart trouble.   REVIEW OF SYSTEMS: Unobtainable due to patient not able to answer any questions.   PHYSICAL EXAMINATION: VITAL SIGNS: Temperature 97.8, pulse 100, respirations 20, blood pressure 160/115, O2 of 97%.  GENERAL: The patient is a thin, white female, appears confused. Not following commands.  HEENT: Head atraumatic, normocephalic. Pupils equally round and reactive to light and accommodation. There is no conjunctival pallor. No sclerae icterus. Nasal exam shows no drainage or ulceration. Oropharynx is very dry.  NECK: Supple without any thyromegaly. No carotid bruits.  CARDIOVASCULAR: Regular rate and rhythm. No murmurs, rubs, clicks, or gallops.  LUNGS: Clear to auscultation bilaterally without any rales, rhonchi, wheezing.  ABDOMEN: Soft, nontender, nondistended. Positive bowel sounds x 4.  EXTREMITIES: No clubbing, cyanosis, or edema.  SKIN: No rash.  LYMPH NODES: Nonpalpable.  MUSCULOSKELETAL: There is no erythema or swelling.  VASCULAR: Good DP/PT pulses.  PSYCHIATRIC: Does not appear anxious or depressed.  NEUROLOGICAL: Cranial nerves II through XII grossly appear intact. Unable to test sensation. Strength: Patient is able to squeeze my right hand, but is not able to follow complete commands. Left upper extremity, there is some weakness. Babinski downgoing. Reflexes 2+. Otherwise, a detailed neurologic exam is not unobtainable due to patient not cooperating.   LABORATORY DATA: Glucose 91, BUN 42, creatinine 1.19, sodium 146, potassium 3.9, chloride 111, CO2 of 25, calcium 9.4. LFTs showed a bilirubin total of  3.5, alkaline phosphatase 78,  AST 85, ALT 88, CPK 286. CK-MB is at 9.9. Troponin 0.08. WBC 10, hemoglobin 15.6, platelet count 132,000. EKG: Normal sinus rhythm with nonspecific ST-T wave changes. CT scan of the head without contrast shows a large low density region in the left frontal lobe suggestive of subacute infarct. Chest  x-ray shows cardiomegaly with vascular congestion, basilar atelectasis, questionable small effusion.   ASSESSMENT AND PLAN: The patient is a 81 year old being brought in with aphagia, confusion.  1.  Acute encephalopathy due to acute cerebrovascular accident. We will get an MRI of the brain, carotid Dopplers. The patient was on aspirin, so I will start her on Aggrenox. Echocardiogram, telemetry, and a neurologic consultation will be obtained.  2.  Accelerated hypertension. We will continue Coreg and Micardis. I will add p.r.n. hydralazine.  3.  History of congestive heart failure, appears to be compensated. Monitor respiratory status for now.  4.  Hyperlipidemia. We will give her simvastatin.  5.  Miscellaneous. Elevated liver function tests of unclear cause. We will have to follow up on her liver function tests. If still elevated, consider hepatitis panel.  6.  Elevated cardiac enzymes. Monitor her cardiac enzymes. If continue to trend upward, cardiology consultation.  7.  Lovenox for deep vein thrombosis prophylaxis.   TIME SPENT: 60 minutes.    ____________________________ Lacie ScottsShreyang H. Allena KatzPatel, MD shp:at D: 11/05/2014 18:01:33 ET T: 11/05/2014 18:28:02 ET JOB#: [id number]  cc: Sally Reimers H. Allena KatzPatel, MD, <Dictator> Charise CarwinSHREYANG H Jeannelle Wiens MD ELECTRONICALLY SIGNED 11/11/2014 15:24

## 2015-01-08 NOTE — Consult Note (Signed)
   Comments   I met with pt's sister, Mechele Claude, who is pt's HCPOA and with brother. Updated them on pt's current condition. They understand that pt's neurologic deficits may not improve and that she cannot return home. They are inclined to accept SNF for STR in [facility] rather than inpt rehab as traveling to [place] to visit pt will be difficult. discussed feeding tube if pt were not able to take in adequate nutrition and they would not want PEG. We discussed code status. Both sister and brother are in agreement with DNR. Order entered.  expressed appreciation for meeting. All questions answered.     Electronic Signatures: Nicholle Falzon, Izora Gala (MD)  (Signed 03-Mar-16 17:54)  Authored: Palliative Care   Last Updated: 03-Mar-16 17:54 by Corrie Reder, Izora Gala (MD)

## 2015-06-22 IMAGING — CR DG CHEST 1V PORT
1 series · 1 of 1 positions shown · non-contrast
Comparison: CT chest 04/04/2011.

CLINICAL DATA: Chest pain.  Shortness of breath and vomiting.

EXAM:
PORTABLE CHEST - 1 VIEW

[ap]
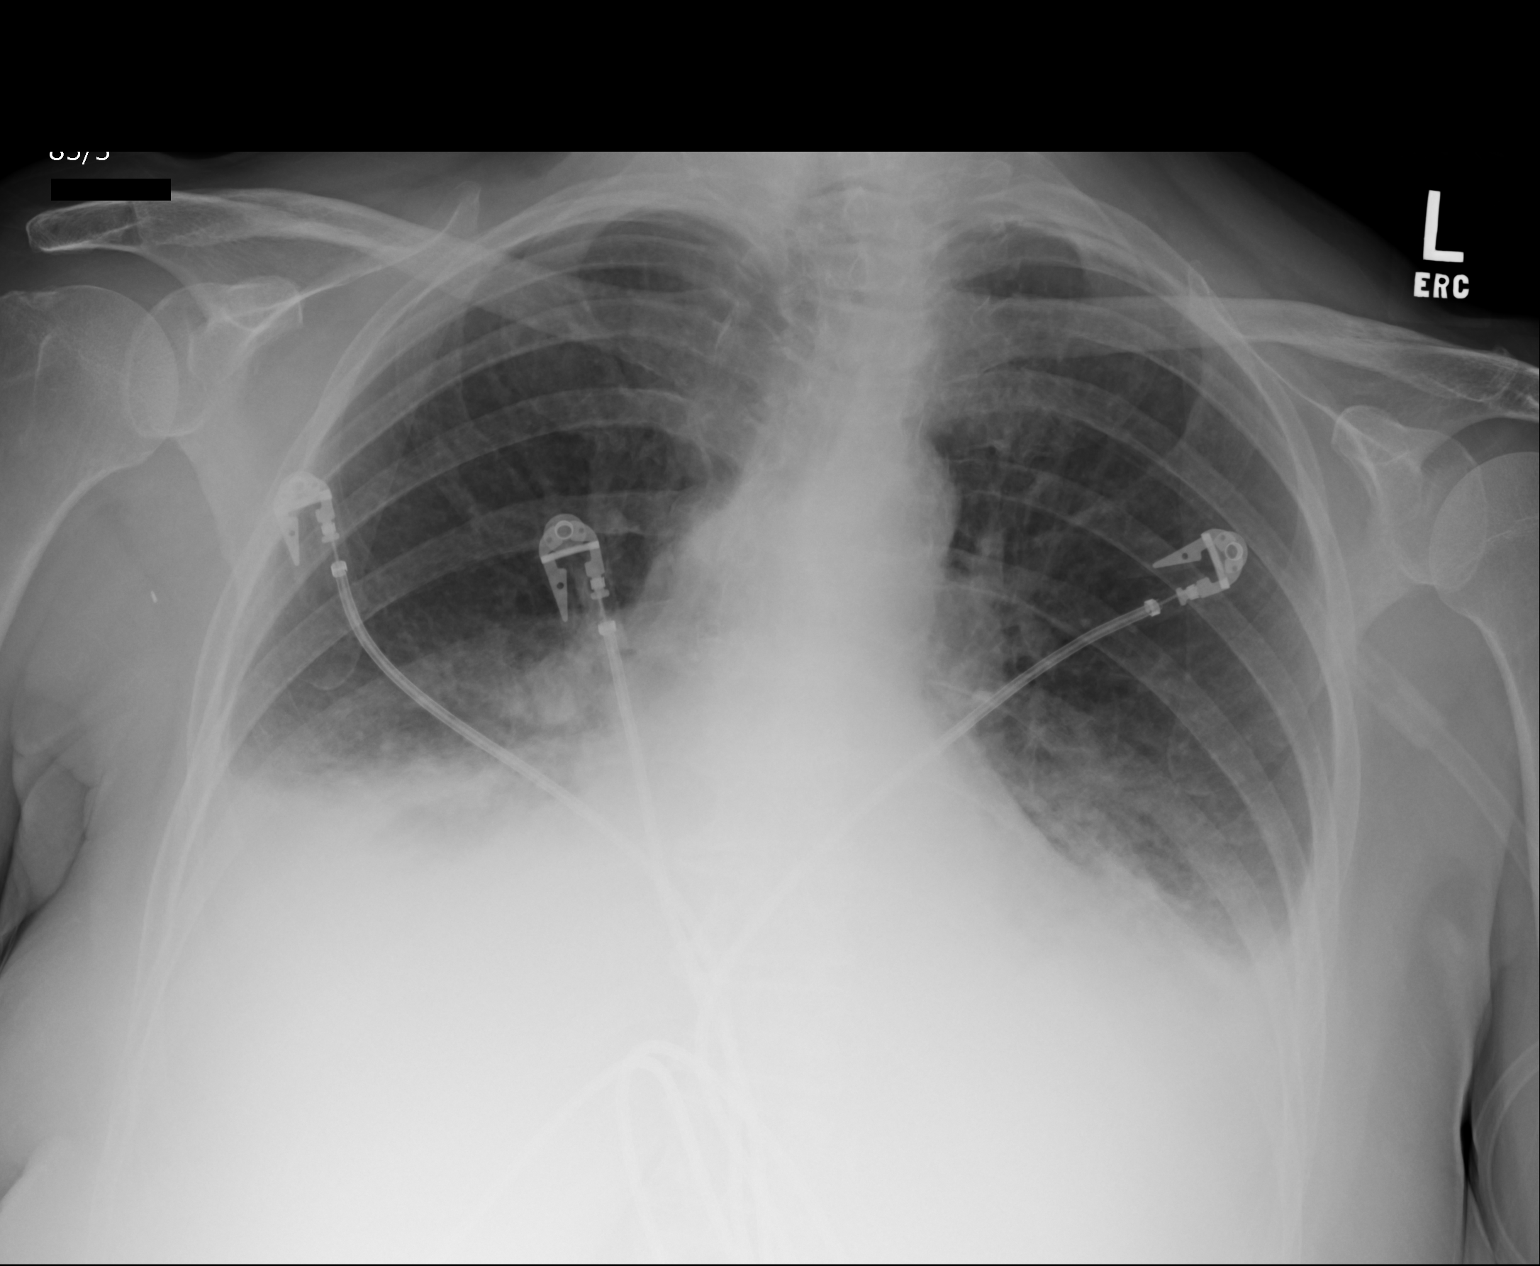

[1 of 1 positions shown; findings below may reference images not displayed]

FINDINGS: The heart size is normal. Bilateral pleural effusions and airspace
disease is present. Mild pulmonary vascular congestion is evident.
The upper lung fields are clear. The visualized soft tissues and
bony thorax are unremarkable.
IMPRESSION: 1. New bilateral pleural effusions and associated airspace disease.
2. Mild pulmonary vascular congestion suggests some element of
congestive heart failure.

## 2015-06-24 IMAGING — CR DG CHEST 2V
1 series · 2 of 2 positions shown · non-contrast
Comparison: 02/19/2014

CLINICAL DATA: Dyspnea

EXAM:
CHEST  2 VIEW

[Series 1: x chest ap · 0.14mm/px · 2 of 2 slices shown]
[im 1/2]
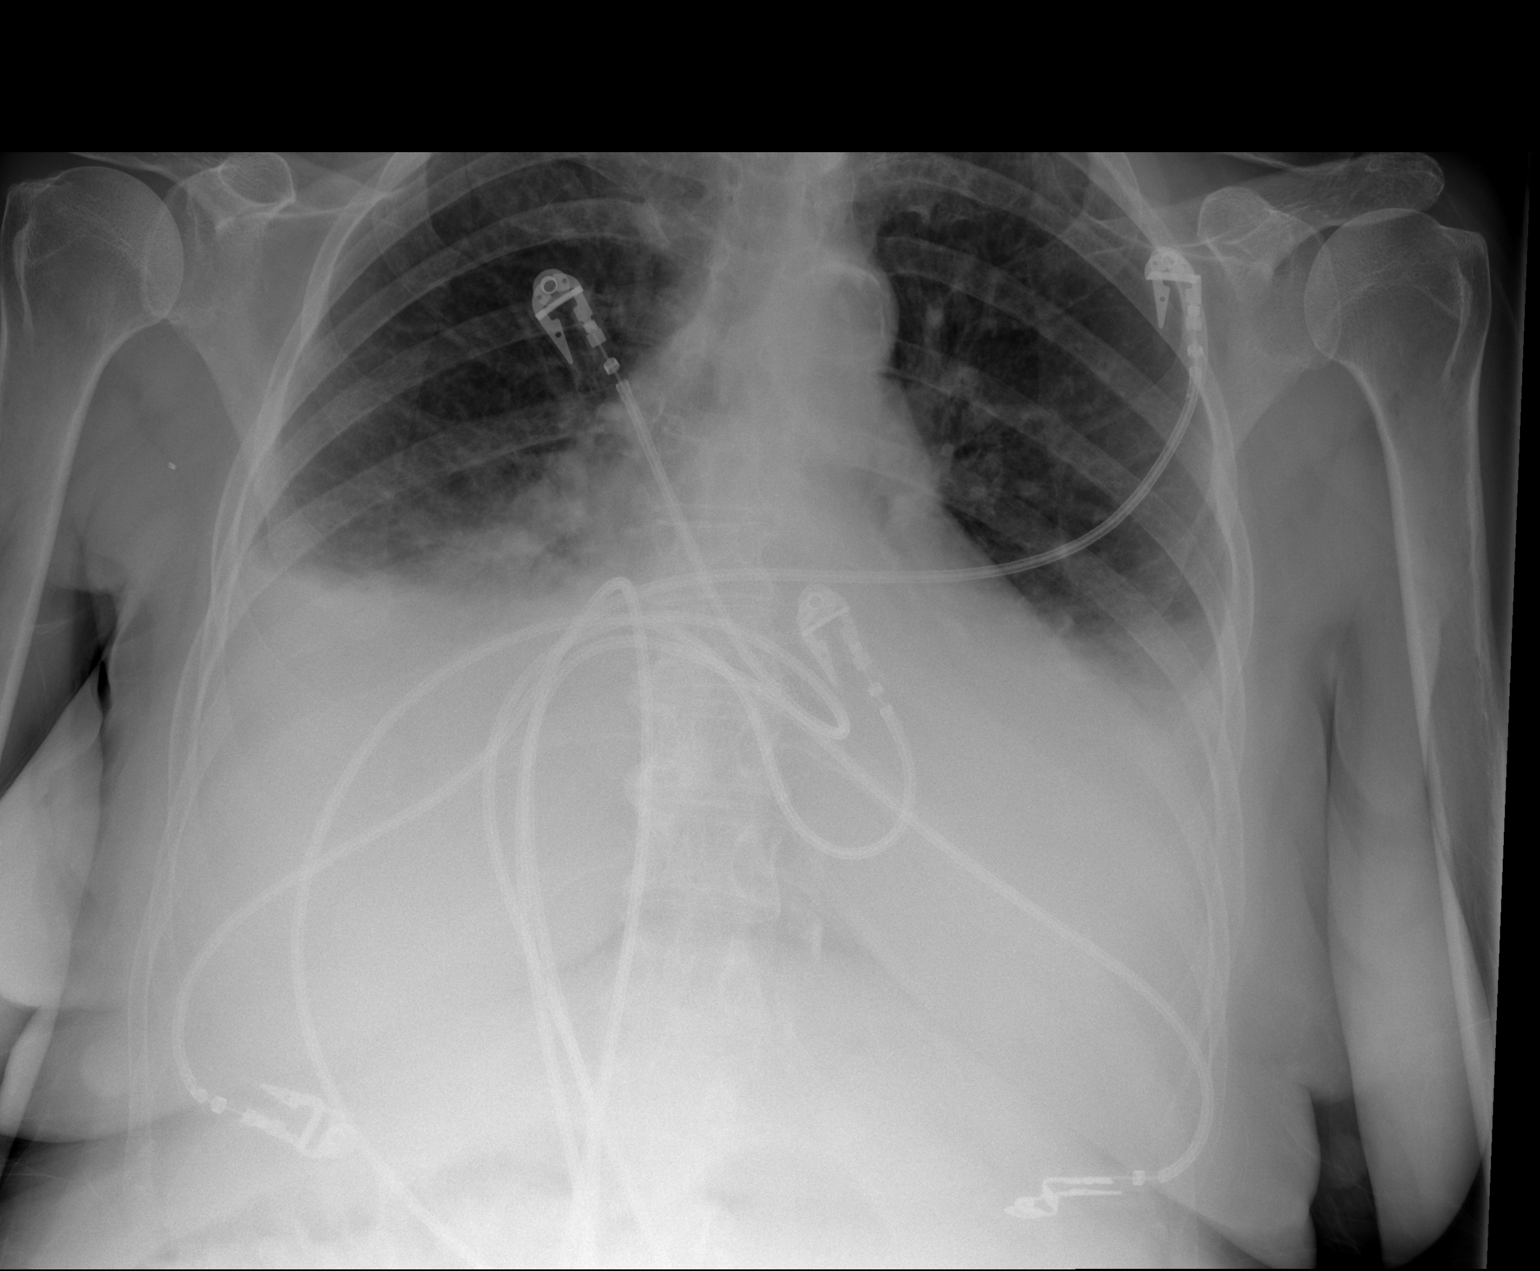
[im 2/2]
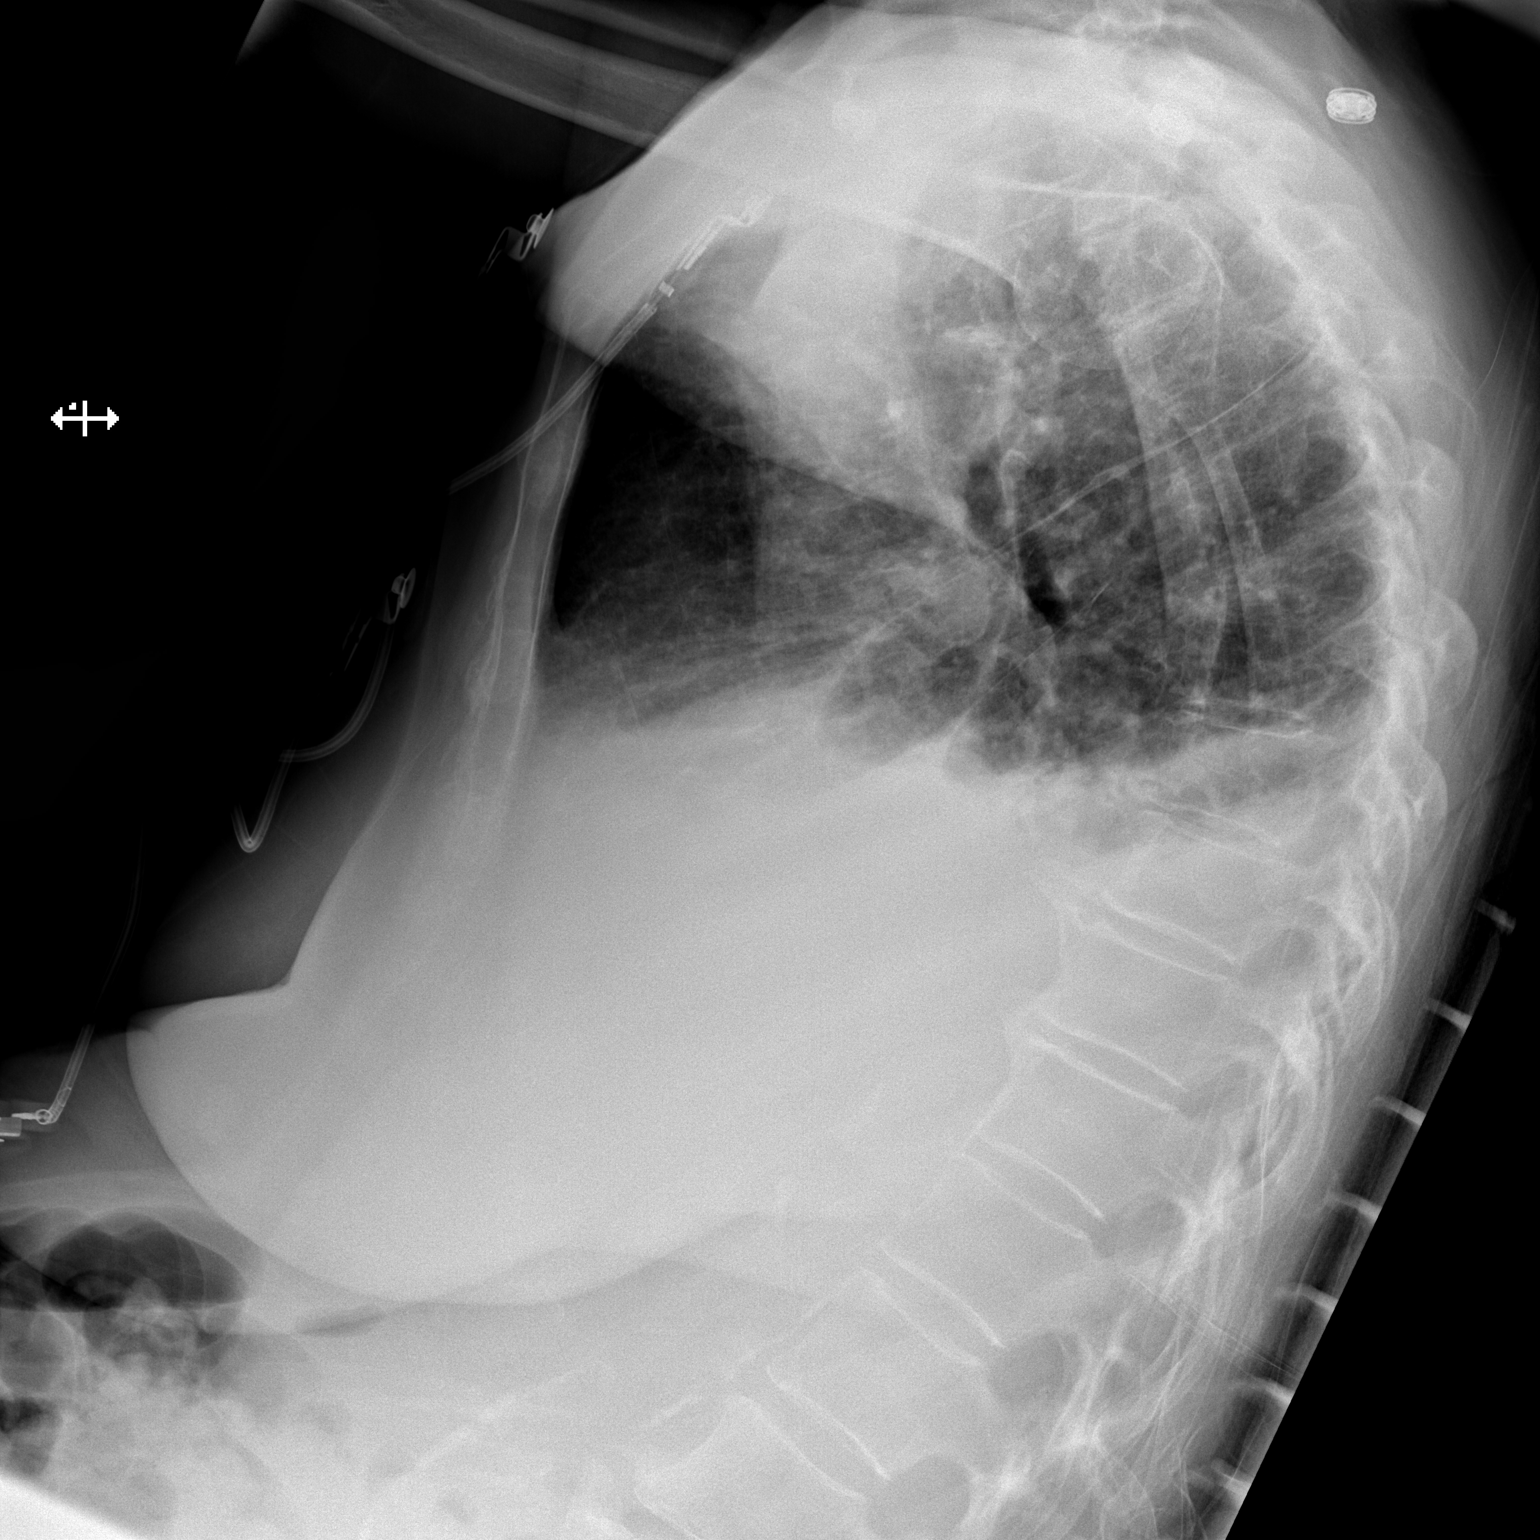

[2 of 2 positions shown; findings below may reference images not displayed]

FINDINGS: Cardiac shadow is stable. Bilateral pleural effusions and bibasilar
atelectatic changes are again seen and stable. No new focal
infiltrate is noted. No bony abnormality is seen.
IMPRESSION: Stable changes in the bases bilaterally. No new focal abnormality is
seen.
